# Patient Record
Sex: Female | Born: 1975 | Race: White | Hispanic: No | Marital: Married | Smoking: Current every day smoker
Health system: Southern US, Community
[De-identification: ages and names within clinical notes are randomized; demographics above are authoritative.]

## PROBLEM LIST (undated history)

## (undated) DIAGNOSIS — I10 Essential (primary) hypertension: Secondary | ICD-10-CM

---

## 2017-08-19 ENCOUNTER — Encounter (HOSPITAL_COMMUNITY): Payer: Self-pay | Admitting: Emergency Medicine

## 2017-08-19 ENCOUNTER — Emergency Department (HOSPITAL_COMMUNITY)
Admission: EM | Admit: 2017-08-19 | Discharge: 2017-08-19 | Disposition: A | Discharge status: Home / Self Care | Payer: Medicaid Other | Attending: Emergency Medicine | Admitting: Emergency Medicine

## 2017-08-19 ENCOUNTER — Emergency Department (HOSPITAL_COMMUNITY): Payer: Medicaid Other

## 2017-08-19 DIAGNOSIS — F17210 Nicotine dependence, cigarettes, uncomplicated: Secondary | ICD-10-CM | POA: Diagnosis not present

## 2017-08-19 DIAGNOSIS — R140 Abdominal distension (gaseous): Secondary | ICD-10-CM | POA: Diagnosis not present

## 2017-08-19 DIAGNOSIS — R1084 Generalized abdominal pain: Secondary | ICD-10-CM | POA: Diagnosis present

## 2017-08-19 DIAGNOSIS — N83201 Unspecified ovarian cyst, right side: Secondary | ICD-10-CM | POA: Diagnosis not present

## 2017-08-19 DIAGNOSIS — R1011 Right upper quadrant pain: Secondary | ICD-10-CM

## 2017-08-19 DIAGNOSIS — K625 Hemorrhage of anus and rectum: Secondary | ICD-10-CM

## 2017-08-19 DIAGNOSIS — I10 Essential (primary) hypertension: Secondary | ICD-10-CM | POA: Diagnosis not present

## 2017-08-19 HISTORY — DX: Essential (primary) hypertension: I10

## 2017-08-19 LAB — COMPREHENSIVE METABOLIC PANEL
ALT: 9 U/L — AB (ref 14–54)
AST: 15 U/L (ref 15–41)
Albumin: 4.2 g/dL (ref 3.5–5.0)
Alkaline Phosphatase: 44 U/L (ref 38–126)
Anion gap: 11 (ref 5–15)
BUN: 11 mg/dL (ref 6–20)
CHLORIDE: 104 mmol/L (ref 101–111)
CO2: 23 mmol/L (ref 22–32)
CREATININE: 0.85 mg/dL (ref 0.44–1.00)
Calcium: 9.2 mg/dL (ref 8.9–10.3)
GFR calc non Af Amer: >60 mL/min (ref >60)
Glucose, Bld: 88 mg/dL (ref 65–99)
POTASSIUM: 3.4 mmol/L — AB (ref 3.5–5.1)
SODIUM: 138 mmol/L (ref 135–145)
Total Bilirubin: 0.8 mg/dL (ref 0.3–1.2)
Total Protein: 7.3 g/dL (ref 6.5–8.1)

## 2017-08-19 LAB — URINALYSIS, ROUTINE W REFLEX MICROSCOPIC
BILIRUBIN URINE: NEGATIVE
Glucose, UA: NEGATIVE mg/dL
KETONES UR: 5 mg/dL — AB
LEUKOCYTES UA: NEGATIVE
Nitrite: NEGATIVE
PH: 6 (ref 5.0–8.0)
PROTEIN: NEGATIVE mg/dL
Specific Gravity, Urine: 1.01 (ref 1.005–1.030)

## 2017-08-19 LAB — CBC
HEMATOCRIT: 42.8 % (ref 36.0–46.0)
Hemoglobin: 14.3 g/dL (ref 12.0–15.0)
MCH: 30.1 pg (ref 26.0–34.0)
MCHC: 33.4 g/dL (ref 30.0–36.0)
MCV: 90.1 fL (ref 78.0–100.0)
PLATELETS: 267 10*3/uL (ref 150–400)
RBC: 4.75 MIL/uL (ref 3.87–5.11)
RDW: 12.7 % (ref 11.5–15.5)
WBC: 7.2 10*3/uL (ref 4.0–10.5)

## 2017-08-19 LAB — SAMPLE TO BLOOD BANK

## 2017-08-19 LAB — LIPASE, BLOOD: LIPASE: 47 U/L (ref 11–51)

## 2017-08-19 LAB — POC OCCULT BLOOD, ED: Fecal Occult Bld: POSITIVE — AB

## 2017-08-19 LAB — I-STAT BETA HCG BLOOD, ED (MC, WL, AP ONLY)

## 2017-08-19 LAB — PREGNANCY, URINE: PREG TEST UR: NEGATIVE

## 2017-08-19 MED ORDER — SODIUM CHLORIDE 0.9 % IV BOLUS (SEPSIS)
1000.0000 mL | Freq: Once | INTRAVENOUS | Status: AC
Start: 2017-08-19 — End: 2017-08-19
  Administered 2017-08-19: 1000 mL via INTRAVENOUS

## 2017-08-19 MED ORDER — ONDANSETRON 4 MG PO TBDP: 4.0000 mg | ORAL_TABLET | Freq: Three times a day (TID) | ORAL | 0 refills | Status: DC | PRN

## 2017-08-19 MED ORDER — IOPAMIDOL (ISOVUE-300) INJECTION 61%
100.0000 mL | Freq: Once | INTRAVENOUS | Status: AC | PRN
  Administered 2017-08-19: 100 mL via INTRAVENOUS

## 2017-08-19 MED ORDER — ONDANSETRON HCL 4 MG/2ML IJ SOLN: 4.0000 mg | Freq: Once | INTRAMUSCULAR | Status: DC

## 2017-08-19 MED ORDER — DICYCLOMINE HCL 20 MG PO TABS: 20.0000 mg | ORAL_TABLET | Freq: Four times a day (QID) | ORAL | 0 refills | Status: DC | PRN

## 2017-08-19 MED ORDER — SODIUM CHLORIDE 0.9 % IV BOLUS (SEPSIS)
500.0000 mL | Freq: Once | INTRAVENOUS | Status: AC
  Administered 2017-08-19: 500 mL via INTRAVENOUS

## 2017-08-19 NOTE — ED Triage Notes (Signed)
Pt C/O N/V on Saturday that turned into bloody stool. Pt states that it continued to be bloody until yesterday morning. Since yesterday morning pt has not passed and diarrhea.

## 2017-08-19 NOTE — Discharge Instructions (Signed)
Take the prescriptions as directed.  Increase your fluid intake (ie:  Gatoraide) for the next few days.  Eat a bland diet and advance to your regular diet slowly as you can tolerate it. Avoid full strength juices, as well as milk and milk products until your diarrhea has resolved.   Call your regular medical doctor today to schedule a follow up appointment this week. Call your OB/GYN doctor today to schedule a follow up appointment within the next week.  Return to the Emergency Department immediately sooner if worsening.

## 2017-08-19 NOTE — ED Provider Notes (Signed)
[facility] EMERGENCY DEPARTMENT Provider Note   CSN: [id number] Arrival date & time: 08/19/17  0550  Time seen 06:11 AM    History   Chief Complaint Chief Complaint  Patient presents with  . Abdominal Pain    HPI Karen Haas is a 41 y.o. female.  HPI patient reports the evening of December 15 she had multiple episodes of nausea and vomiting over several hours to the point of dry heaves.  She then started having loose diarrhea.  However the next morning, December 16 she started having bloody diarrhea and had 3-4 episodes a day, the last episode was yesterday morning, December 18.  She states she thinks she had a fever the first night because she was sweating and had chills.  She did eat a little bit yesterday however anything she ate made her abdomen feel very bloated.  She states her abdomen was very noisy and was rambling.  She started having right upper quadrant pain last night and bloating and took a Gas-X which helped.  430 this morning she woke up again with the pain.  She denies feeling lightheaded or dizzy.  She states she has had some tightness in her upper abdomen in the past that is resolved with taking Tums.  She also has had some abdominal tightening in the past.  She states her brother has a history of proctitis but she denies any family history of Crohn's or ulcerative colitis.  PCP Dr Thedore Mins in [place]  Past Medical History:  Diagnosis Date  . Hypertension     There are no active problems to display for this patient.   History reviewed. No pertinent surgical history.  OB History    No data available       Home Medications    Prior to Admission medications   Medication Sig Start Date End Date Taking? Authorizing Provider  lisinopril (PRINIVIL,ZESTRIL) 10 MG tablet Take 10 mg by mouth daily.   Yes [provider]    Family History No family history on file.  Social History Social History   Tobacco Use  . Smoking status: Current  Every Day Smoker    Types: Cigarettes  . Smokeless tobacco: Never Used  Substance Use Topics  . Alcohol use: No    Frequency: Never  . Drug use: No  lives at home Lives with spouse Self employed Environmental manager, blogger about farm life   Allergies   Patient has no allergy information on record.   Review of Systems Review of Systems  All other systems reviewed and are negative.    Physical Exam Updated Vital Signs BP (!) 156/97   Pulse 100   Temp 97.7 F (36.5 C) (Oral)   Resp 19   LMP 07/20/2017 (Approximate)   SpO2 100%   Vital signs normal borderline tachycardia   Physical Exam  Constitutional: She is oriented to person, place, and time. She appears well-developed and well-nourished.  Non-toxic appearance. She does not appear ill. No distress.  HENT:  Head: Normocephalic and atraumatic.  Right Ear: External ear normal.  Left Ear: External ear normal.  Nose: Nose normal. No mucosal edema or rhinorrhea.  Mouth/Throat: Oropharynx is clear and moist and mucous membranes are normal. No dental abscesses or uvula swelling.  Eyes: Conjunctivae and EOM are normal. Pupils are equal, round, and reactive to light.  Neck: Normal range of motion and full passive range of motion without pain. Neck supple.  Cardiovascular: Normal rate, regular rhythm and normal heart sounds. Exam reveals no  gallop and no friction rub.  No murmur heard. Pulmonary/Chest: Effort normal and breath sounds normal. No respiratory distress. She has no wheezes. She has no rhonchi. She has no rales. She exhibits no tenderness and no crepitus.  Abdominal: Soft. Normal appearance and bowel sounds are normal. She exhibits no distension. There is no tenderness. There is no rebound and no guarding.    Area of pain noted however she is nontender right now  Musculoskeletal: Normal range of motion. She exhibits no edema or tenderness.  Moves all extremities well.   Neurological: She is alert and oriented to  person, place, and time. She has normal strength. No cranial nerve deficit.  Skin: Skin is warm, dry and intact. No rash noted. No erythema. No pallor.  Psychiatric: Her speech is normal and behavior is normal. Her mood appears anxious.  Nursing note and vitals reviewed.    ED Treatments / Results  Labs (all labs ordered are listed, but only abnormal results are displayed) Results for orders placed or performed during the hospital encounter of 08/19/17  Lipase, blood  Result Value Ref Range   Lipase 47 11 - 51 U/L  Comprehensive metabolic panel  Result Value Ref Range   Sodium 138 135 - 145 mmol/L   Potassium 3.4 (L) 3.5 - 5.1 mmol/L   Chloride 104 101 - 111 mmol/L   CO2 23 22 - 32 mmol/L   Glucose, Bld 88 65 - 99 mg/dL   BUN 11 6 - 20 mg/dL   Creatinine, Ser 4.090.85 0.44 - 1.00 mg/dL   Calcium 9.2 8.9 - 81.110.3 mg/dL   Total Protein 7.3 6.5 - 8.1 g/dL   Albumin 4.2 3.5 - 5.0 g/dL   AST 15 15 - 41 U/L   ALT 9 (L) 14 - 54 U/L   Alkaline Phosphatase 44 38 - 126 U/L   Total Bilirubin 0.8 0.3 - 1.2 mg/dL   GFR calc non Af Amer >60 >60 mL/min   GFR calc Af Amer >60 >60 mL/min   Anion gap 11 5 - 15  CBC  Result Value Ref Range   WBC 7.2 4.0 - 10.5 K/uL   RBC 4.75 3.87 - 5.11 MIL/uL   Hemoglobin 14.3 12.0 - 15.0 g/dL   HCT 91.442.8 78.236.0 - 95.646.0 %   MCV 90.1 78.0 - 100.0 fL   MCH 30.1 26.0 - 34.0 pg   MCHC 33.4 30.0 - 36.0 g/dL   RDW 21.312.7 08.611.5 - 57.815.5 %   Platelets 267 150 - 400 K/uL  Urinalysis, Routine w reflex microscopic  Result Value Ref Range   Color, Urine YELLOW YELLOW   APPearance CLEAR CLEAR   Specific Gravity, Urine 1.010 1.005 - 1.030   pH 6.0 5.0 - 8.0   Glucose, UA NEGATIVE NEGATIVE mg/dL   Hgb urine dipstick MODERATE (A) NEGATIVE   Bilirubin Urine NEGATIVE NEGATIVE   Ketones, ur 5 (A) NEGATIVE mg/dL   Protein, ur NEGATIVE NEGATIVE mg/dL   Nitrite NEGATIVE NEGATIVE   Leukocytes, UA NEGATIVE NEGATIVE   RBC / HPF 0-5 0 - 5 RBC/hpf   WBC, UA 0-5 0 - 5 WBC/hpf    Bacteria, UA RARE (A) NONE SEEN   Squamous Epithelial / LPF 0-5 (A) NONE SEEN   Mucus PRESENT   Pregnancy, urine  Result Value Ref Range   Preg Test, Ur NEGATIVE NEGATIVE  I-Stat beta hCG blood, ED  Result Value Ref Range   I-stat hCG, quantitative <5.0 <5 mIU/mL   Comment 3  Laboratory interpretation all normal except mild hypokalemia   EKG  EKG Interpretation None       Radiology No results found.  Procedures Procedures (including critical care time)  Medications Ordered in ED Medications  sodium chloride 0.9 % bolus 500 mL (not administered)  ondansetron (ZOFRAN) injection 4 mg (not administered)  sodium chloride 0.9 % bolus 1,000 mL (1,000 mLs Intravenous New Bag/Given 08/19/17 0639)  iopamidol (ISOVUE-300) 61 % injection 100 mL (100 mLs Intravenous Contrast Given 08/19/17 0654)     Initial Impression / Assessment and Plan / ED Course  I have reviewed the triage vital signs and the nursing notes.  Pertinent labs & imaging results that were available during my care of the patient were reviewed by me and considered in my medical decision making (see chart for details).     Laboratory testing was done.  CT of the abdomen and ultrasound of the gallbladder was done to evaluate her complaints.  She may have underlying inflammatory bowel disease however the pain on the right upper quadrant consideration for gallbladder disease should be considered although that would not cause rectal bleeding.  Her brother does have a history of proctitis so there may be some family history of inflammatory bowel disease.  She is refusing pain or nausea medicine at this time.  She was given IV fluids.  Recheck at 7:50 AM we discussed her laboratory results which were normal.  Looked at her CT scan, will we are still waiting for the radiologist to read it but she does have a lot of gas and distention of her colon diffusely.  She has some cysts in both kidneys. IUD in place.   08:00  AM Patient left at change of shift with Dr Clarene DukeMcManus to get results of her AP CT scan and US RUQ.   Final Clinical Impressions(s) / ED Diagnoses   Final diagnoses:  Abdominal bloating  Rectal bleeding  RUQ pain    Disposition pending  Devoria AlbeIva Elyssia Strausser, MD, Concha PyoFACEP    Ronnette Rump, MD 08/19/17 [phone]

## 2017-08-19 NOTE — ED Provider Notes (Signed)
Pt received at sign out with CT A/P and US RUQ pending.  Pt with RUQ pain, "gassy" feeling abd, N/V/D. Labs reassuring. CT and US without acute surgical or infectious process. No vomiting/diarrhea in ED. Tol PO. Tx symptomatically at this time. Dx and testing d/w pt.  Questions answered.  Verb understanding, agreeable to d/c home with outpt f/u.   Results for orders placed or performed during the hospital encounter of 08/19/17  Lipase, blood  Result Value Ref Range   Lipase 47 11 - 51 U/L  Comprehensive metabolic panel  Result Value Ref Range   Sodium 138 135 - 145 mmol/L   Potassium 3.4 (L) 3.5 - 5.1 mmol/L   Chloride 104 101 - 111 mmol/L   CO2 23 22 - 32 mmol/L   Glucose, Bld 88 65 - 99 mg/dL   BUN 11 6 - 20 mg/dL   Creatinine, Ser 1.610.85 0.44 - 1.00 mg/dL   Calcium 9.2 8.9 - 09.610.3 mg/dL   Total Protein 7.3 6.5 - 8.1 g/dL   Albumin 4.2 3.5 - 5.0 g/dL   AST 15 15 - 41 U/L   ALT 9 (L) 14 - 54 U/L   Alkaline Phosphatase 44 38 - 126 U/L   Total Bilirubin 0.8 0.3 - 1.2 mg/dL   GFR calc non Af Amer >60 >60 mL/min   GFR calc Af Amer >60 >60 mL/min   Anion gap 11 5 - 15  CBC  Result Value Ref Range   WBC 7.2 4.0 - 10.5 K/uL   RBC 4.75 3.87 - 5.11 MIL/uL   Hemoglobin 14.3 12.0 - 15.0 g/dL   HCT 04.542.8 40.936.0 - 81.146.0 %   MCV 90.1 78.0 - 100.0 fL   MCH 30.1 26.0 - 34.0 pg   MCHC 33.4 30.0 - 36.0 g/dL   RDW 91.412.7 78.211.5 - 95.615.5 %   Platelets 267 150 - 400 K/uL  Urinalysis, Routine w reflex microscopic  Result Value Ref Range   Color, Urine YELLOW YELLOW   APPearance CLEAR CLEAR   Specific Gravity, Urine 1.010 1.005 - 1.030   pH 6.0 5.0 - 8.0   Glucose, UA NEGATIVE NEGATIVE mg/dL   Hgb urine dipstick MODERATE (A) NEGATIVE   Bilirubin Urine NEGATIVE NEGATIVE   Ketones, ur 5 (A) NEGATIVE mg/dL   Protein, ur NEGATIVE NEGATIVE mg/dL   Nitrite NEGATIVE NEGATIVE   Leukocytes, UA NEGATIVE NEGATIVE   RBC / HPF 0-5 0 - 5 RBC/hpf   WBC, UA 0-5 0 - 5 WBC/hpf   Bacteria, UA RARE (A) NONE SEEN    Squamous Epithelial / LPF 0-5 (A) NONE SEEN   Mucus PRESENT   Pregnancy, urine  Result Value Ref Range   Preg Test, Ur NEGATIVE NEGATIVE  I-Stat beta hCG blood, ED  Result Value Ref Range   I-stat hCG, quantitative <5.0 <5 mIU/mL   Comment 3          POC occult blood, ED RN will collect  Result Value Ref Range   Fecal Occult Bld POSITIVE (A) NEGATIVE  Sample to Blood Bank  Result Value Ref Range   Blood Bank Specimen SAMPLE AVAILABLE FOR TESTING    Sample Expiration 08/22/2017    Ct Abdomen Pelvis W Contrast Result Date: 08/19/2017 CLINICAL DATA:  Nausea and vomiting on Saturday.  Blood in stool. EXAM: CT ABDOMEN AND PELVIS WITH CONTRAST TECHNIQUE: Multidetector CT imaging of the abdomen and pelvis was performed using the standard protocol following bolus administration of intravenous contrast. CONTRAST:  100mL ISOVUE-300  IOPAMIDOL (ISOVUE-300) INJECTION 61% COMPARISON:  None. FINDINGS: Lower chest: Clear lung bases. Heart size accentuated by a pectus excavatum deformity. Hepatobiliary: Minimal motion degradation throughout. Normal liver. Normal gallbladder, without biliary ductal dilatation. Pancreas: Normal, without mass or ductal dilatation. Spleen: Normal in size, without focal abnormality. Adrenals/Urinary Tract: Normal adrenal glands. Punctate upper and lower pole left renal collecting system calculi. Well-circumscribed bilateral renal lesions are too small to characterize but likely cysts. No hydronephrosis. Normal urinary bladder. Stomach/Bowel: Normal stomach, without wall thickening. Normal colon. Appendix likely identified and normal, including on image 61/ series 2. Normal small bowel. Vascular/Lymphatic: Normal caliber of the aorta and branch vessels. No abdominopelvic adenopathy. Reproductive: Intrauterine device. Right ovarian corpus luteal cyst suspected, including at 2.3 cm on image 65/series 2. Adjacent more simple appearing 3.7 cm right ovarian cyst. Other: Trace cul-de-sac  fluid may be physiologic. There is trace perihepatic ascites, including on image 37/series 2. Musculoskeletal: T11 vertebral hemangioma. IMPRESSION: 1.  No acute process in the abdomen or pelvis. 2. Small volume perihepatic ascites, of indeterminate etiology. 3. Right ovarian corpus luteal cyst. Small volume cul-de-sac fluid could be physiologic or related to recent cyst rupture. 4. Minimal motion degradation. 5. Left nephrolithiasis. Electronically Signed   By: Jeronimo GreavesKyle  Talbot M.D.   On: 08/19/2017 08:08   [place] Abdomen Limited Ruq Result Date: 08/19/2017 CLINICAL DATA:  Right upper quadrant pain and bloating for 1 day. EXAM: ULTRASOUND ABDOMEN LIMITED RIGHT UPPER QUADRANT COMPARISON:  CT abdomen and pelvis 08/19/2017 FINDINGS: Gallbladder: No gallstones or wall thickening visualized. No sonographic Murphy sign noted by sonographer. Common bile duct: Diameter: 2 mm Liver: No focal lesion identified. Within normal limits in parenchymal echogenicity. Portal vein is patent on color Doppler imaging with normal direction of blood flow towards the liver. IMPRESSION: Unremarkable right upper quadrant ultrasound. Electronically Signed   By: Sebastian AcheAllen  Grady M.D.   On: 08/19/2017 08:43      Samuel JesterMcManus, Ashrita Chrismer, DO 08/19/17 [phone]

## 2017-08-24 ENCOUNTER — Encounter: Payer: Self-pay | Admitting: Nurse Practitioner

## 2017-08-24 ENCOUNTER — Other Ambulatory Visit: Payer: Self-pay

## 2017-08-24 ENCOUNTER — Ambulatory Visit: Payer: Medicaid Other | Admitting: Nurse Practitioner

## 2017-08-24 DIAGNOSIS — R634 Abnormal weight loss: Secondary | ICD-10-CM

## 2017-08-24 DIAGNOSIS — R109 Unspecified abdominal pain: Secondary | ICD-10-CM

## 2017-08-24 DIAGNOSIS — R197 Diarrhea, unspecified: Secondary | ICD-10-CM

## 2017-08-24 DIAGNOSIS — K625 Hemorrhage of anus and rectum: Secondary | ICD-10-CM

## 2017-08-24 MED ORDER — NA SULFATE-K SULFATE-MG SULF 17.5-3.13-1.6 GM/177ML PO SOLN: 1.0000 | ORAL | 0 refills | Status: DC

## 2017-08-24 NOTE — Progress Notes (Signed)
Primary Care Physician:  Smith RobertKikel, Stephen, MD Primary Gastroenterologist:  Dr. Darrick PennaFields  Chief Complaint  Patient presents with  . Rectal Bleeding    Happened about 12 times last week    HPI:   Karen Haas is a 41 y.o. female who presents on referral from primary care for rectal bleeding.  PCP notes reviewed, last saw a primary care 08/16/2017 which noted persistent rectal bleeding, bloody stools, abdominal cramping, diarrhea.  Known history of hemorrhoids.  Unexplained weight loss.  The patient was seen in the emergency department 08/19/2017 for abdominal pain.  At that time noted nausea and vomiting as well, diarrhea, bloody diarrhea 3-4 episodes a day.  Query fever the night before because of sweating and chills.  This yeast today after.  Noted overall general GI upset.  CBC, CMP, lipase essentially normal.  Negative urine pregnancy test.  Family history of inflammatory bowel disease in her brother with proctitis.  CT abdomen found no acute process, right ovarian corpus luteal cyst, minimal motion degradation, left nephrolithiasis.  Ultrasound completed found unremarkable right upper quadrant ultrasound.  Today she states she's doing ok overall. Has never had a colonoscopy. She had about 12 episodes of rectal bleeding over 4-5 days, last episode of blood 08/18/17. Symptoms started with diarrhea and abdominal pain followed by rectal bleeding. She has Oyster stew around the time she started with symptoms, but nobody else became ill. Thinks she may be lactose intolerant due to gas and diarrhea. Denies abdominal pain in about the past week. States when she started having issues her stools were "small pieces that were really dark." History of intermittent/rare GERD, none in the past week or so. Occasional/rare NSAIDs,no ASA powders. Does have a history of hemorrhoids. Denies N/V currently. Has had some unintentional weight loss of about 5 lbs (enough that her husband noted). Decreased appetite.  Denies fever, chills, acute changes in bowel habits (other than per above). Denies chest pain, dyspnea, dizziness, lightheadedness, syncope, near syncope. Denies any other upper or lower GI symptoms.  Past Medical History:  Diagnosis Date  . Hypertension     History reviewed. No pertinent surgical history.  Current Outpatient Medications  Medication Sig Dispense Refill  . lisinopril (PRINIVIL,ZESTRIL) 10 MG tablet Take 10 mg by mouth daily.     No current facility-administered medications for this visit.     Allergies as of 08/24/2017  . (No Known Allergies)    Family History  Problem Relation Age of Onset  . Inflammatory bowel disease Brother        proctitis  . Colon cancer Neg Hx     Social History   Socioeconomic History  . Marital status: Married    Spouse name: Not on file  . Number of children: Not on file  . Years of education: Not on file  . Highest education level: Not on file  Social Needs  . Financial resource strain: Not on file  . Food insecurity - worry: Not on file  . Food insecurity - inability: Not on file  . Transportation needs - medical: Not on file  . Transportation needs - non-medical: Not on file  Occupational History  . Not on file  Tobacco Use  . Smoking status: Current Every Day Smoker    Packs/day: 0.10    Types: Cigarettes  . Smokeless tobacco: Never Used  Substance and Sexual Activity  . Alcohol use: No    Frequency: Never  . Drug use: No  . Sexual activity: Not on  file  Other Topics Concern  . Not on file  Social History Narrative  . Not on file    Review of Systems: Complete ROS negative except as per HPI.    Physical Exam: BP 138/85   Pulse 98   Temp (!) 97.3 F (36.3 C) (Oral)   Ht 5\' 9"  (1.753 m)   Wt 154 lb (69.9 kg)   LMP 08/23/2017 (Exact Date)   BMI 22.74 kg/m  General:   Alert and oriented. Pleasant and cooperative. Well-nourished and well-developed.  Eyes:  Without icterus, sclera clear and conjunctiva  pink.  Ears:  Normal auditory acuity. Cardiovascular:  S1, S2 present without murmurs appreciated. Extremities without clubbing or edema. Respiratory:  Clear to auscultation bilaterally. No wheezes, rales, or rhonchi. No distress.  Gastrointestinal:  +BS, soft, non-tender and non-distended. No HSM noted. No guarding or rebound. No masses appreciated.  Rectal:  Deferred  Musculoskalatal:  Symmetrical without gross deformities. Neurologic:  Alert and oriented x4;  grossly normal neurologically. Psych:  Alert and cooperative. Normal mood and affect. Heme/Lymph/Immune: No excessive bruising noted.    08/24/2017 9:18 AM   Disclaimer: This note was dictated with voice recognition software. Similar sounding words can inadvertently be transcribed and may not be corrected upon review.

## 2017-08-24 NOTE — Assessment & Plan Note (Addendum)
The patient has had weight loss in the past 1-2 weeks with her acute illness.  This is somewhat expected.  However, before that she has had about 5-10 pound weight loss unintentionally to the point that her husband noticed and asked her about it.  Has also had decreased appetite.  Given her constellation of symptoms we will proceed with colonoscopy as per above.  Return for follow-up in 3 months.

## 2017-08-24 NOTE — Assessment & Plan Note (Signed)
Acute onset right-sided abdominal pain in addition to diarrhea and rectal bleeding.  CT the abdomen did find a right corpus luteal cyst which could at least in part explain her abdominal pain.  Her pain is better now.  The cyst and/or abdominal pain could have been incidental.  However, given her constellation of symptoms we will plan for colonoscopy as per below.  Return for follow-up in 3 months.

## 2017-08-24 NOTE — Patient Instructions (Addendum)
1. We will provide you with a list of primary care providers in the area to help you find a PCP to become established with them. 2. Have your lab tests drawn when you are able to. 3. We will schedule your procedure for you. 4. Return for follow-up in 3 months. 5. Call if you have any questions or concerns.

## 2017-08-24 NOTE — Assessment & Plan Note (Signed)
During her symptomatic episode she was having rectal bleeding.  She had a total of 12 episodes.  Mostly with bowel movements but some more just frank bleeding.  Last bleeding episode about 1 week ago.  She does have a brother with a history of proctitis/inflammatory bowel disease.  No family history of colon cancer.  Given her constellation of symptoms and rectal bleeding we will plan for colonoscopy.  Return for follow-up in 3 months.  Proceed with colonoscopy with Dr. Darrick PennaFields in the near future. The risks, benefits, and alternatives have been discussed in detail with the patient. They state understanding and desire to proceed.   The patient is not currently on any anticoagulants, anxiolytics, chronic pain medications, or antidepressants.  Conscious sedation should be adequate for her procedure.

## 2017-08-24 NOTE — Assessment & Plan Note (Signed)
About 2 weeks ago the patient began having acute onset abdominal pain, diarrhea followed shortly thereafter by rectal bleeding (as per above).  She did have oyster stew, but doubts this as the etiology given that nobody else became ill.  She thinks she may have some lactose intolerance, and we can work this out at her follow-up visit.  Diarrhea was persistent for about 5-6 days and then self resolved.  She does have a history of hemorrhoids.  Diarrhea with hemorrhoids in the realm of self-limiting gastroenteritis could have caused rectal bleeding.  Given her constellation of symptoms we will plan for colonoscopy as per below.  Return for follow-up in 3 months.  No ongoing diarrhea currently.

## 2017-08-24 NOTE — Progress Notes (Signed)
CC'D TO PCP °

## 2017-08-25 LAB — CBC WITH DIFFERENTIAL/PLATELET
BASOS ABS: 40 {cells}/uL (ref 0–200)
Basophils Relative: 0.8 %
EOS ABS: 40 {cells}/uL (ref 15–500)
EOS PCT: 0.8 %
HCT: 41.4 % (ref 35.0–45.0)
HEMOGLOBIN: 14.1 g/dL (ref 11.7–15.5)
Lymphs Abs: 1240 cells/uL (ref 850–3900)
MCH: 30.1 pg (ref 27.0–33.0)
MCHC: 34.1 g/dL (ref 32.0–36.0)
MCV: 88.3 fL (ref 80.0–100.0)
MONOS PCT: 6.7 %
MPV: 10.6 fL (ref 7.5–12.5)
NEUTROS ABS: 3345 {cells}/uL (ref 1500–7800)
Neutrophils Relative %: 66.9 %
PLATELETS: 278 10*3/uL (ref 140–400)
RBC: 4.69 10*6/uL (ref 3.80–5.10)
RDW: 12 % (ref 11.0–15.0)
TOTAL LYMPHOCYTE: 24.8 %
WBC mixed population: 335 cells/uL (ref 200–950)
WBC: 5 10*3/uL (ref 3.8–10.8)

## 2017-08-25 LAB — COMPREHENSIVE METABOLIC PANEL
AG RATIO: 2 (calc) (ref 1.0–2.5)
ALT: 8 U/L (ref 6–29)
AST: 13 U/L (ref 10–30)
Albumin: 4.4 g/dL (ref 3.6–5.1)
Alkaline phosphatase (APISO): 51 U/L (ref 33–115)
BUN: 8 mg/dL (ref 7–25)
CO2: 26 mmol/L (ref 20–32)
CREATININE: 0.77 mg/dL (ref 0.50–1.10)
Calcium: 9 mg/dL (ref 8.6–10.2)
Chloride: 106 mmol/L (ref 98–110)
GLUCOSE: 93 mg/dL (ref 65–139)
Globulin: 2.2 g/dL (calc) (ref 1.9–3.7)
Potassium: 3.6 mmol/L (ref 3.5–5.3)
SODIUM: 140 mmol/L (ref 135–146)
TOTAL PROTEIN: 6.6 g/dL (ref 6.1–8.1)
Total Bilirubin: 0.5 mg/dL (ref 0.2–1.2)

## 2017-08-25 LAB — SEDIMENTATION RATE: SED RATE: 2 mm/h (ref 0–20)

## 2017-08-25 LAB — C-REACTIVE PROTEIN: CRP: 0.5 mg/L (ref <8.0)

## 2017-09-02 NOTE — Progress Notes (Signed)
Left vm that labs are normal. Keep appointments as scheduled and call if questions.

## 2017-09-04 ENCOUNTER — Encounter (HOSPITAL_COMMUNITY): Payer: Self-pay | Admitting: *Deleted

## 2017-09-04 ENCOUNTER — Ambulatory Visit (HOSPITAL_COMMUNITY)
Admission: RE | Admit: 2017-09-04 | Discharge: 2017-09-04 | Disposition: A | Discharge status: Home / Self Care | Payer: Medicaid Other | Source: Ambulatory Visit | Attending: Gastroenterology | Admitting: Gastroenterology

## 2017-09-04 ENCOUNTER — Encounter (HOSPITAL_COMMUNITY)
Admission: RE | Disposition: A | Discharge status: Home / Self Care | Payer: Self-pay | Source: Ambulatory Visit | Attending: Gastroenterology

## 2017-09-04 DIAGNOSIS — K648 Other hemorrhoids: Secondary | ICD-10-CM | POA: Insufficient documentation

## 2017-09-04 DIAGNOSIS — R634 Abnormal weight loss: Secondary | ICD-10-CM

## 2017-09-04 DIAGNOSIS — R197 Diarrhea, unspecified: Secondary | ICD-10-CM

## 2017-09-04 DIAGNOSIS — R109 Unspecified abdominal pain: Secondary | ICD-10-CM

## 2017-09-04 DIAGNOSIS — Q439 Congenital malformation of intestine, unspecified: Secondary | ICD-10-CM | POA: Insufficient documentation

## 2017-09-04 DIAGNOSIS — Z791 Long term (current) use of non-steroidal anti-inflammatories (NSAID): Secondary | ICD-10-CM | POA: Diagnosis not present

## 2017-09-04 DIAGNOSIS — R1031 Right lower quadrant pain: Secondary | ICD-10-CM | POA: Insufficient documentation

## 2017-09-04 DIAGNOSIS — Z79899 Other long term (current) drug therapy: Secondary | ICD-10-CM | POA: Diagnosis not present

## 2017-09-04 DIAGNOSIS — K625 Hemorrhage of anus and rectum: Secondary | ICD-10-CM

## 2017-09-04 DIAGNOSIS — F17210 Nicotine dependence, cigarettes, uncomplicated: Secondary | ICD-10-CM | POA: Diagnosis not present

## 2017-09-04 DIAGNOSIS — K644 Residual hemorrhoidal skin tags: Secondary | ICD-10-CM | POA: Insufficient documentation

## 2017-09-04 DIAGNOSIS — K921 Melena: Secondary | ICD-10-CM | POA: Diagnosis not present

## 2017-09-04 DIAGNOSIS — I10 Essential (primary) hypertension: Secondary | ICD-10-CM | POA: Diagnosis not present

## 2017-09-04 HISTORY — PX: COLONOSCOPY: SHX5424

## 2017-09-04 SURGERY — COLONOSCOPY
Anesthesia: Moderate Sedation

## 2017-09-04 MED ORDER — MEPERIDINE HCL 100 MG/ML IJ SOLN
INTRAMUSCULAR | Status: DC | PRN
  Administered 2017-09-04 (×4): 25 mg via INTRAVENOUS

## 2017-09-04 MED ORDER — STERILE WATER FOR IRRIGATION IR SOLN
Status: DC | PRN
  Administered 2017-09-04: 2.5 mL

## 2017-09-04 MED ORDER — MIDAZOLAM HCL 5 MG/5ML IJ SOLN
INTRAMUSCULAR | Status: AC
  Filled 2017-09-04: qty 10

## 2017-09-04 MED ORDER — MEPERIDINE HCL 100 MG/ML IJ SOLN
INTRAMUSCULAR | Status: AC
  Filled 2017-09-04: qty 2

## 2017-09-04 MED ORDER — SODIUM CHLORIDE 0.9 % IV SOLN
INTRAVENOUS | Status: DC
  Administered 2017-09-04: 08:00:00 via INTRAVENOUS

## 2017-09-04 MED ORDER — MIDAZOLAM HCL 5 MG/5ML IJ SOLN
INTRAMUSCULAR | Status: DC | PRN
  Administered 2017-09-04: 2 mg via INTRAVENOUS
  Administered 2017-09-04: 1 mg via INTRAVENOUS
  Administered 2017-09-04: 2 mg via INTRAVENOUS

## 2017-09-04 NOTE — Discharge Instructions (Signed)
You have moderate size internal hemorrhoids. YOU DID NOT HAVE ANY POLYPS.   DRINK WATER TO KEEP YOUR URINE LIGHT YELLOW.  FOLLOW A HIGH FIBER DIET. AVOID ITEMS THAT CAUSE BLOATING. SEE INFO BELOW.  USE PREPARATION H FOUR TIMES  A DAY IF NEEDED TO RELIEVE RECTAL PAIN/PRESSURE/BLEEDING.  FOLLOW UP IN 6 MOS.   Next colonoscopy in 10 years.  Colonoscopy Care After Read the instructions outlined below and refer to this sheet in the next week. These discharge instructions provide you with general information on caring for yourself after you leave the hospital. While your treatment has been planned according to the most current medical practices available, unavoidable complications occasionally occur. If you have any problems or questions after discharge, call DR. Daymeon Fischman, [phone].  ACTIVITY  You may resume your regular activity, but move at a slower pace for the next 24 hours.   Take frequent rest periods for the next 24 hours.   Walking will help get rid of the air and reduce the bloated feeling in your belly (abdomen).   No driving for 24 hours (because of the medicine (anesthesia) used during the test).   You may shower.   Do not sign any important legal documents or operate any machinery for 24 hours (because of the anesthesia used during the test).    NUTRITION  Drink plenty of fluids.   You may resume your normal diet as instructed by your doctor.   Begin with a light meal and progress to your normal diet. Heavy or fried foods are harder to digest and may make you feel sick to your stomach (nauseated).   Avoid alcoholic beverages for 24 hours or as instructed.    MEDICATIONS  You may resume your normal medications.   WHAT YOU CAN EXPECT TODAY  Some feelings of bloating in the abdomen.   Passage of more gas than usual.   Spotting of blood in your stool or on the toilet paper  .  IF YOU HAD POLYPS REMOVED DURING THE COLONOSCOPY:  Eat a soft diet IF YOU  HAVE NAUSEA, BLOATING, ABDOMINAL PAIN, OR VOMITING.    FINDING OUT THE RESULTS OF YOUR TEST Not all test results are available during your visit. DR. Darrick PennaFIELDS WILL CALL YOU WITHIN 14 DAYS OF YOUR PROCEDUE WITH YOUR RESULTS. Do not assume everything is normal if you have not heard from DR. Elnoria Livingston, CALL HER OFFICE AT [phone].  SEEK IMMEDIATE MEDICAL ATTENTION AND CALL THE OFFICE: [phone] IF:  You have more than a spotting of blood in your stool.   Your belly is swollen (abdominal distention).   You are nauseated or vomiting.   You have a temperature over 101F.   You have abdominal pain or discomfort that is severe or gets worse throughout the day.  High-Fiber Diet A high-fiber diet changes your normal diet to include more whole grains, legumes, fruits, and vegetables. Changes in the diet involve replacing refined carbohydrates with unrefined foods. The calorie level of the diet is essentially unchanged. The Dietary Reference Intake (recommended amount) for adult males is 38 grams per day. For adult females, it is 25 grams per day. Pregnant and lactating women should consume 28 grams of fiber per day. Fiber is the intact part of a plant that is not broken down during digestion. Functional fiber is fiber that has been isolated from the plant to provide a beneficial effect in the body. PURPOSE  Increase stool bulk.   Ease and regulate bowel movements.   Lower  cholesterol.   REDUCE RISK OF COLON CANCER  INDICATIONS THAT YOU NEED MORE FIBER  Constipation and hemorrhoids.   Uncomplicated diverticulosis (intestine condition) and irritable bowel syndrome.   Weight management.   As a protective measure against hardening of the arteries (atherosclerosis), diabetes, and cancer.   GUIDELINES FOR INCREASING FIBER IN THE DIET  Start adding fiber to the diet slowly. A gradual increase of about 5 more grams (2 slices of whole-wheat bread, 2 servings of most fruits or vegetables, or 1  bowl of high-fiber cereal) per day is best. Too rapid an increase in fiber may result in constipation, flatulence, and bloating.   Drink enough water and fluids to keep your urine clear or pale yellow. Water, juice, or caffeine-free drinks are recommended. Not drinking enough fluid may cause constipation.   Eat a variety of high-fiber foods rather than one type of fiber.   Try to increase your intake of fiber through using high-fiber foods rather than fiber pills or supplements that contain small amounts of fiber.   The goal is to change the types of food eaten. Do not supplement your present diet with high-fiber foods, but replace foods in your present diet.    INCLUDE A VARIETY OF FIBER SOURCES  Replace refined and processed grains with whole grains, canned fruits with fresh fruits, and incorporate other fiber sources. White rice, white breads, and most bakery goods contain little or no fiber.   Brown whole-grain rice, buckwheat oats, and many fruits and vegetables are all good sources of fiber. These include: broccoli, Brussels sprouts, cabbage, cauliflower, beets, sweet potatoes, white potatoes (skin on), carrots, tomatoes, eggplant, squash, berries, fresh fruits, and dried fruits.   Cereals appear to be the richest source of fiber. Cereal fiber is found in whole grains and bran. Bran is the fiber-rich outer coat of cereal grain, which is largely removed in refining. In whole-grain cereals, the bran remains. In breakfast cereals, the largest amount of fiber is found in those with "bran" in their names. The fiber content is sometimes indicated on the label.   You may need to include additional fruits and vegetables each day.   In baking, for 1 cup white flour, you may use the following substitutions:   1 cup whole-wheat flour minus 2 tablespoons.   1/2 cup white flour plus 1/2 cup whole-wheat flour.   Hemorrhoids Hemorrhoids are dilated (enlarged) veins around the rectum. Sometimes  clots will form in the veins. This makes them swollen and painful. These are called thrombosed hemorrhoids. Causes of hemorrhoids include:  Constipation.   Straining to have a bowel movement.   HEAVY LIFTING  HOME CARE INSTRUCTIONS  Eat a well balanced diet and drink 6 to 8 glasses of water every day to avoid constipation. You may also use a bulk laxative.   Avoid straining to have bowel movements.   Keep anal area dry and clean.   Do not use a donut shaped pillow or sit on the toilet for long periods. This increases blood pooling and pain.   Move your bowels when your body has the urge; this will require less straining and will decrease pain and pressure.

## 2017-09-04 NOTE — H&P (Signed)
Primary Care Physician:  Vidal Schwalbe, MD Primary Gastroenterologist:  Dr. Oneida Alar  Pre-Procedure History & Physical: HPI:  Karen Haas is a 42 y.o. female here for Diarrhea/BRBPR.  Past Medical History:  Diagnosis Date  . Hypertension     History reviewed. No pertinent surgical history.  Prior to Admission medications   Medication Sig Start Date End Date Taking? Authorizing Provider  ibuprofen (ADVIL,MOTRIN) 200 MG tablet Take 400 mg by mouth every 8 (eight) hours as needed (FOR PAIN.).   Yes [provider]  levonorgestrel (MIRENA, 52 MG,) 20 MCG/24HR IUD 1 Device by Intrauterine route once.   Yes [provider]  lisinopril (PRINIVIL,ZESTRIL) 10 MG tablet Take 10 mg by mouth daily.   Yes [provider]  Na Sulfate-K Sulfate-Mg Sulf (SUPREP BOWEL PREP KIT) 17.5-3.13-1.6 GM/177ML SOLN Take 1 kit by mouth as directed. 08/24/17  Yes Kawan Valladolid L, MD  PROCTOSOL HC 2.5 % rectal cream Apply 1 application topically 2 (two) times daily as needed for hemorrhoids. 08/16/17  Yes [provider]    Allergies as of 08/24/2017  . (No Known Allergies)    Family History  Problem Relation Age of Onset  . Inflammatory bowel disease Brother        proctitis  . Colon cancer Neg Hx     Social History   Socioeconomic History  . Marital status: Married    Spouse name: Not on file  . Number of children: Not on file  . Years of education: Not on file  . Highest education level: Not on file  Social Needs  . Financial resource strain: Not on file  . Food insecurity - worry: Not on file  . Food insecurity - inability: Not on file  . Transportation needs - medical: Not on file  . Transportation needs - non-medical: Not on file  Occupational History  . Not on file  Tobacco Use  . Smoking status: Current Every Day Smoker    Packs/day: 0.10    Types: Cigarettes  . Smokeless tobacco: Never Used  Substance and Sexual Activity  . Alcohol use: No   Frequency: Never  . Drug use: No  . Sexual activity: Not on file  Other Topics Concern  . Not on file  Social History Narrative  . Not on file    Review of Systems: See HPI, otherwise negative ROS   Physical Exam: BP 109/60   Pulse 75   Temp 98.1 F (36.7 C) (Oral)   Resp 16   LMP 08/23/2017 (Exact Date)   SpO2 100%  General:   Alert,  pleasant and cooperative in NAD Head:  Normocephalic and atraumatic. Neck:  Supple; Lungs:  Clear throughout to auscultation.    Heart:  Regular rate and rhythm. Abdomen:  Soft, nontender and nondistended. Normal bowel sounds, without guarding, and without rebound.   Neurologic:  Alert and  oriented x4;  grossly normal neurologically.  Impression/Plan:     Diarrhea/BRBPR  PLAN: TCS TODAY WITH BIOPSY DISCUSSED PROCEDURE, BENEFITS, & RISKS: < 1% chance of medication reaction, bleeding, perforation, or rupture of spleen/liver.

## 2017-09-04 NOTE — Op Note (Signed)
[facility] Patient Name: Karen Haas Procedure Date: 09/04/2017 8:35 AM MRN: [id number] Date of Birth: 1976-05-29 Attending MD: Jonette Eva MD, MD CSN: [id number] Age: 42 Admit Type: Outpatient Procedure:                Colonoscopy, Diagnostic Indications:              Abdominal pain in the right lower quadrant,                            Diarrhea, Hematochezia. NOW HAVING NORMAL STOOL. Providers:                Jonette Eva MD, MD, Nena Polio, RN, Lollie Marrow.                            [place], Pensions consultant Referring MD:             Smith Robert Medicines:                Meperidine 100 mg IV, Midazolam 5 mg IV Complications:            No immediate complications. Estimated Blood Loss:     Estimated blood loss: none. Procedure:                Pre-Anesthesia Assessment:                           - Prior to the procedure, a History and Physical                            was performed, and patient medications and                            allergies were reviewed. The patient's tolerance of                            previous anesthesia was also reviewed. The risks                            and benefits of the procedure and the sedation                            options and risks were discussed with the patient.                            All questions were answered, and informed consent                            was obtained. Prior Anticoagulants: The patient has                            taken ibuprofen, last dose was 7 days prior to                            procedure. ASA Grade Assessment: I - A normal,  healthy patient. After reviewing the risks and                            benefits, the patient was deemed in satisfactory                            condition to undergo the procedure. After obtaining                            informed consent, the colonoscope was passed under                            direct vision. Throughout the procedure,  the                            patient's blood pressure, pulse, and oxygen                            saturations were monitored continuously. The                            EC-[id number] ([id number]) scope was introduced through                            the anus and advanced to the 5 cm into the ileum.                            The colonoscopy was technically difficult and                            complex due to significant looping. Successful                            completion of the procedure was aided by increasing                            the dose of sedation medication, changing the                            patient to a supine position, using manual pressure                            and COLOWRAP. The patient tolerated the procedure                            fairly well. The quality of the bowel preparation                            was good. The terminal ileum, ileocecal valve,                            appendiceal orifice, and rectum were photographed. Scope In: 9:02:39 AM Scope Out: 9:31:41 AM Scope Withdrawal Time: 0 hours 13 minutes  31 seconds  Total Procedure Duration: 0 hours 29 minutes 2 seconds  Findings:      The terminal ileum appeared normal.      The recto-sigmoid colon, sigmoid colon, descending colon and splenic       flexure revealed significantly excessive looping.      The exam was otherwise without abnormality.      External and internal hemorrhoids were found during retroflexion. The       hemorrhoids were medium-sized. Impression:               - The examined portion of the ileum was normal.                           - There was significant looping of the colon.                           - The examination was otherwise normal.                           - RECTAL BLEEDING DUE TO internal hemorrhoids. Moderate Sedation:      Moderate (conscious) sedation was administered by the endoscopy nurse       and supervised by the endoscopist. The following  parameters were       monitored: oxygen saturation, heart rate, blood pressure, and response       to care. Total physician intraservice time was 42 minutes. Recommendation:           - Repeat colonoscopy in 10 years for surveillance                            WITH COLOWRAP.                           - High fiber diet.                           - Continue present medications.                           - Return to my office in 6 months.                           - Patient has a contact number available for                            emergencies. The signs and symptoms of potential                            delayed complications were discussed with the                            patient. Return to normal activities tomorrow.                            Written discharge instructions were provided to the  patient. Procedure Code(s):        --- Professional ---                           [phone], Colonoscopy, flexible; diagnostic, including                            collection of specimen(s) by brushing or washing,                            when performed (separate procedure)                           99152, Moderate sedation services provided by the                            same physician or other qualified health care                            professional performing the diagnostic or                            therapeutic service that the sedation supports,                            requiring the presence of an independent trained                            observer to assist in the monitoring of the                            patient's level of consciousness and physiological                            status; initial 15 minutes of intraservice time,                            patient age 16 years or older                           [phone], Moderate sedation services; each additional                            15 minutes intraservice time                            99153, Moderate sedation services; each additional                            15 minutes intraservice time Diagnosis Code(s):        --- Professional ---                           K64.8, Other hemorrhoids  R10.31, Right lower quadrant pain                           R19.7, Diarrhea, unspecified                           K92.1, Melena (includes Hematochezia) CPT copyright 2016 [employer]. All rights reserved. The codes documented in this report are preliminary and upon coder review may  be revised to meet current compliance requirements. Jonette Eva, MD Jonette Eva MD, MD 09/04/2017 9:54:34 AM This report has been signed electronically. Number of Addenda: 0

## 2017-09-08 ENCOUNTER — Encounter (HOSPITAL_COMMUNITY): Payer: Self-pay | Admitting: Gastroenterology

## 2017-11-24 ENCOUNTER — Ambulatory Visit: Payer: Medicaid Other | Admitting: Nurse Practitioner

## 2018-03-03 ENCOUNTER — Ambulatory Visit: Payer: Medicaid Other | Admitting: Gastroenterology

## 2018-12-27 IMAGING — US US ABDOMEN LIMITED
1 series · 14 of 25 positions shown · non-contrast
Comparison: CT abdomen and pelvis 08/19/2017

CLINICAL DATA: Right upper quadrant pain and bloating for 1 day.

EXAM:
ULTRASOUND ABDOMEN LIMITED RIGHT UPPER QUADRANT

[Series 1: us abdomen limited · 0.15mm/px · 14 of 62 slices shown]
[im 1/62]
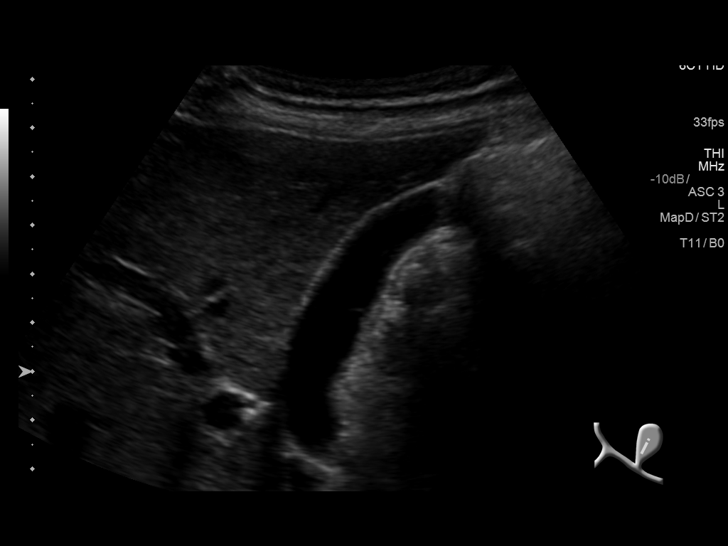
[im 6/62]
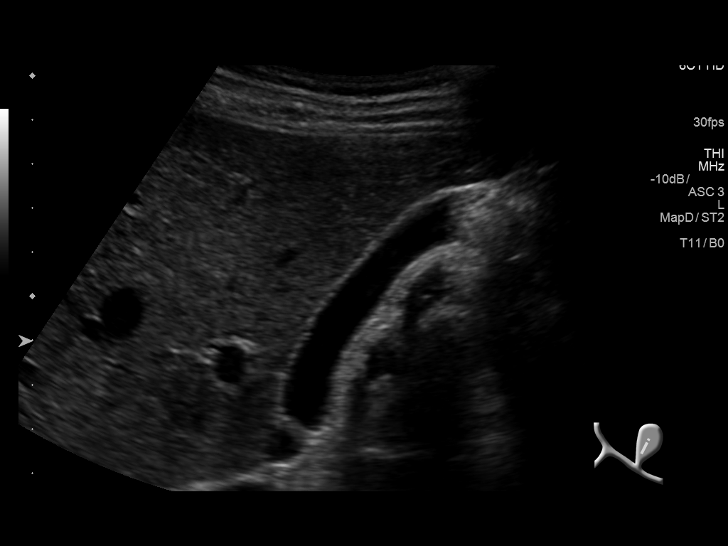
[im 11/62]
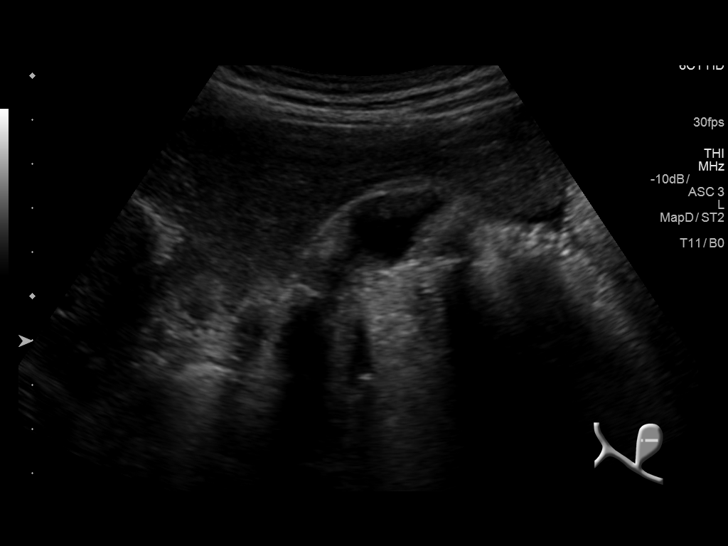
[im 16/62]
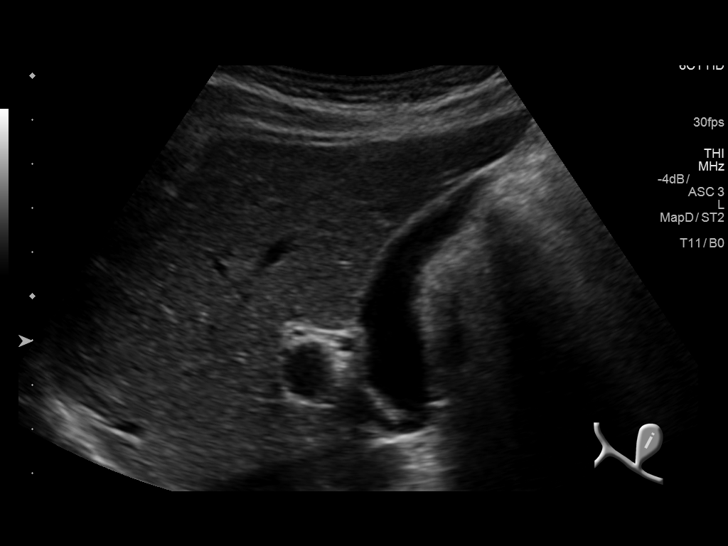
[im 21/62]
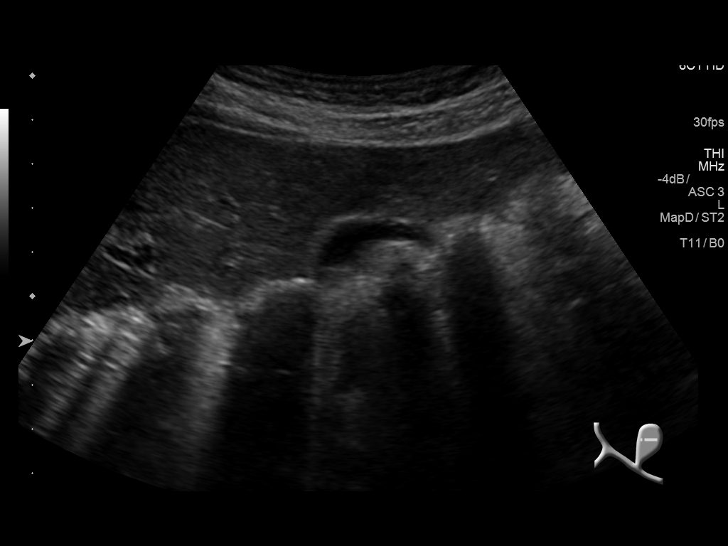
[im 23/62]
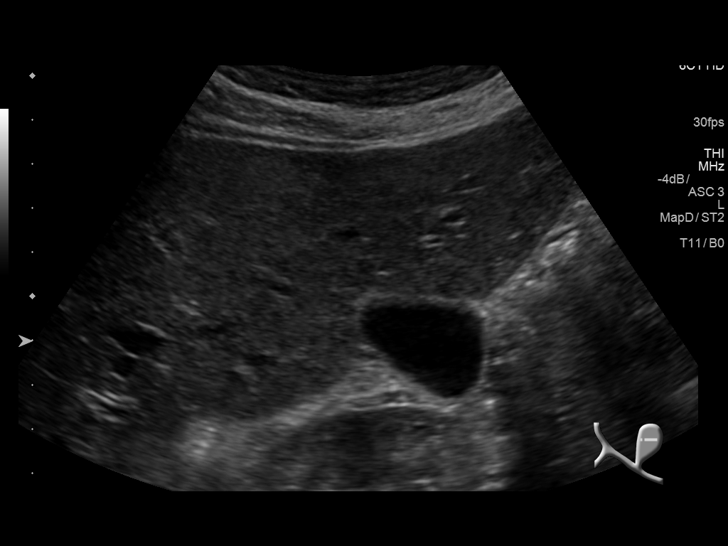
[im 28/62]
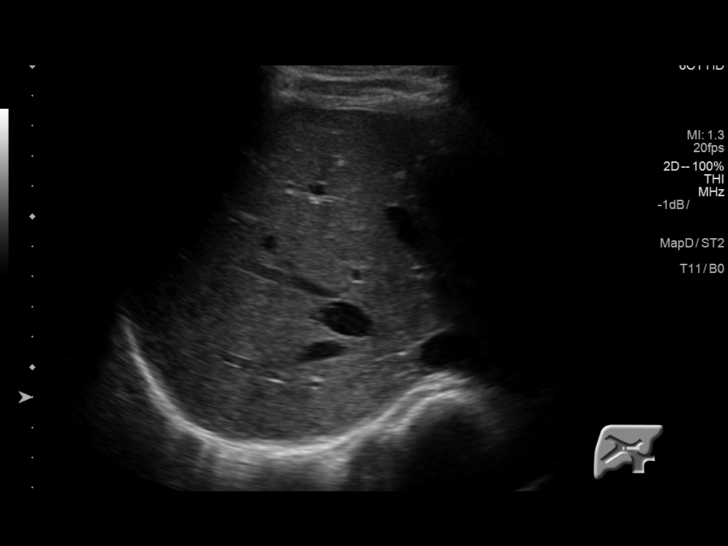
[im 34/62]
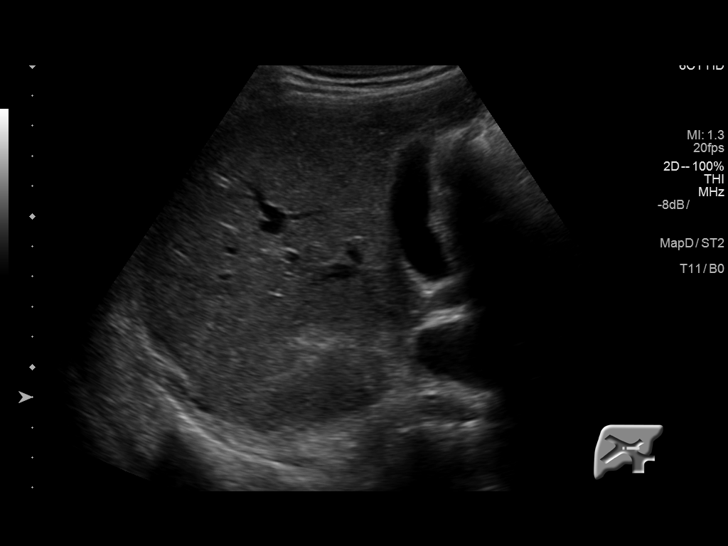
[im 39/62]
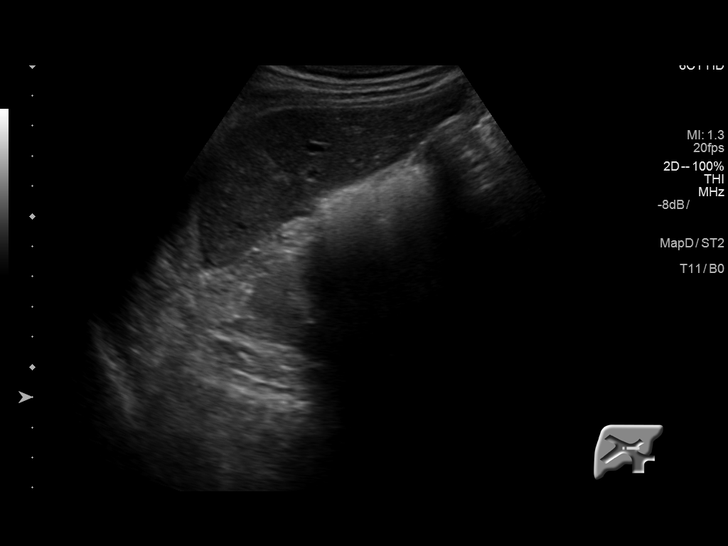
[im 41/62]
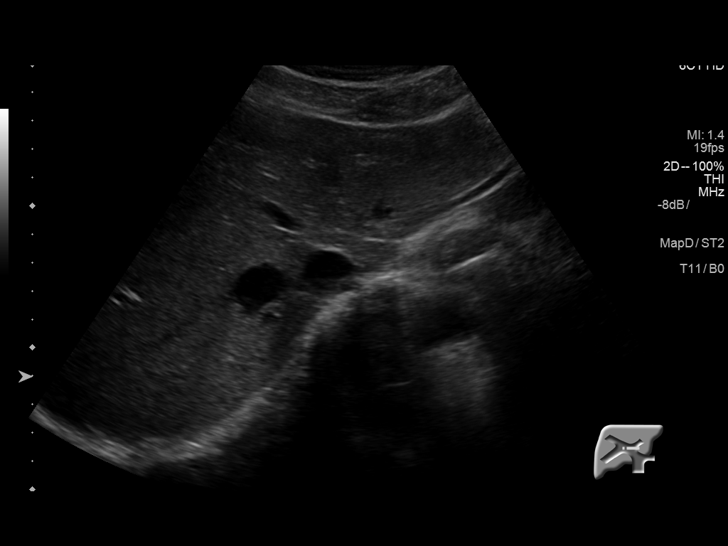
[im 46/62]
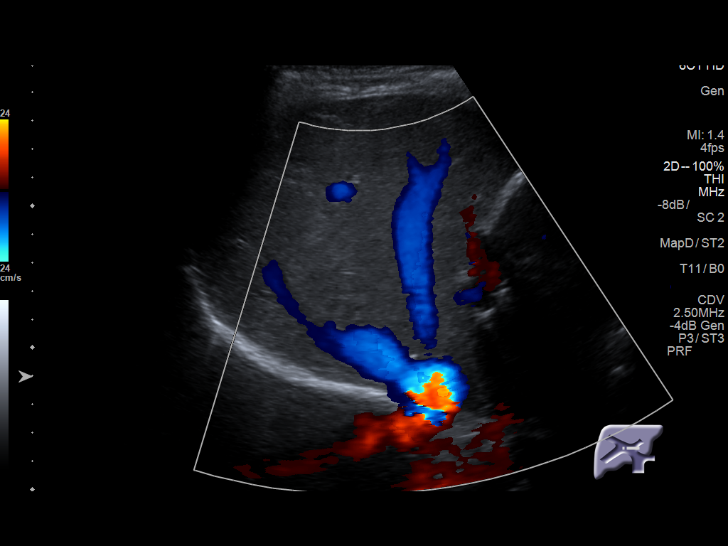
[im 51/62]
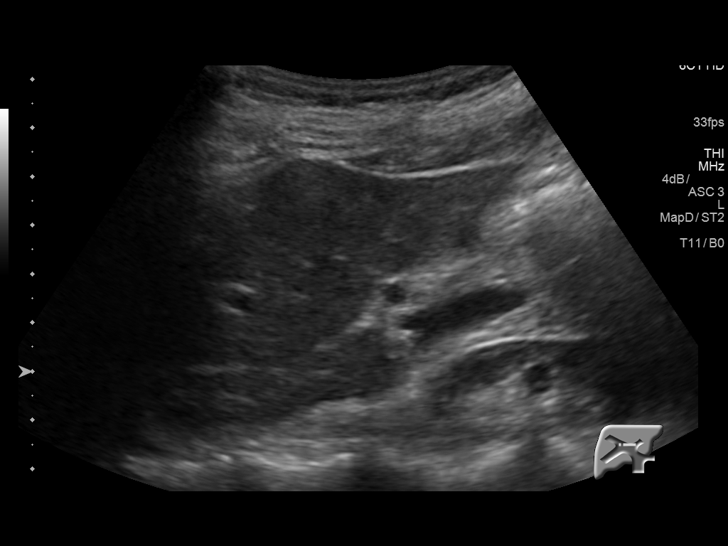
[im 56/62]
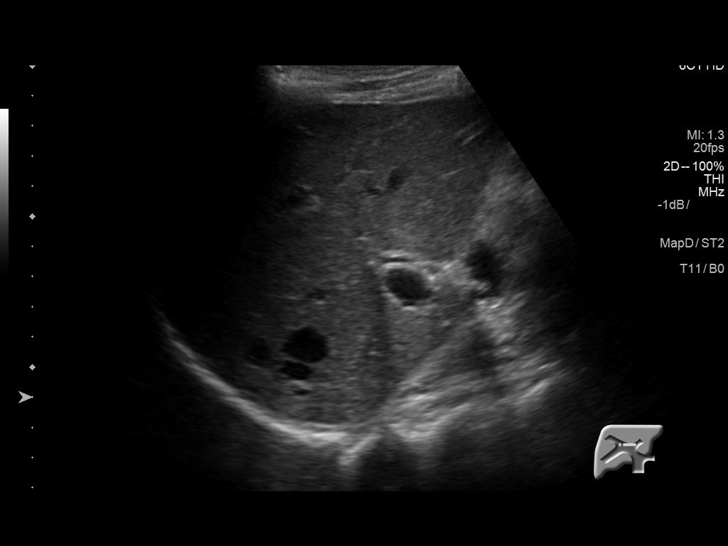
[im 62/62]
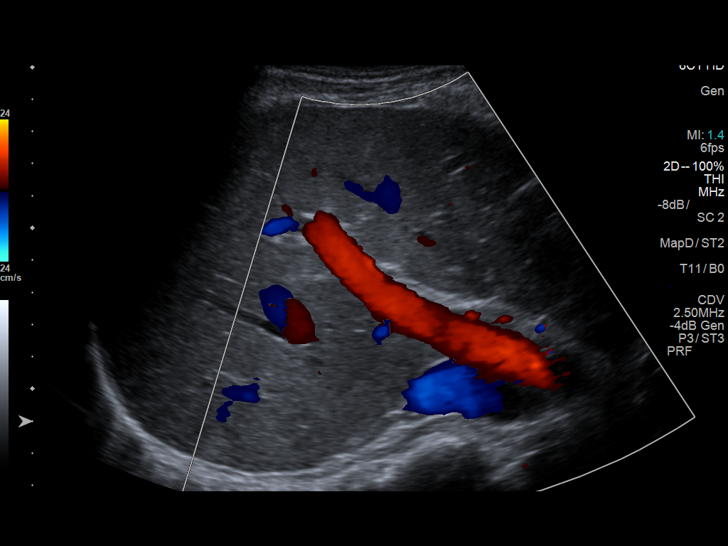

[14 of 25 positions shown; findings below may reference images not displayed]

FINDINGS: Gallbladder:

No gallstones or wall thickening visualized. No sonographic Murphy
sign noted by sonographer.

Common bile duct:

Diameter: 2 mm

Liver:

No focal lesion identified. Within normal limits in parenchymal
echogenicity. Portal vein is patent on color Doppler imaging with
normal direction of blood flow towards the liver.
IMPRESSION: Unremarkable right upper quadrant ultrasound.

## 2023-07-21 ENCOUNTER — Other Ambulatory Visit: Payer: Self-pay | Admitting: Family Medicine

## 2023-07-21 ENCOUNTER — Encounter: Payer: Self-pay | Admitting: *Deleted

## 2023-07-21 DIAGNOSIS — E785 Hyperlipidemia, unspecified: Secondary | ICD-10-CM

## 2023-09-01 ENCOUNTER — Ambulatory Visit (HOSPITAL_COMMUNITY): Payer: Medicaid Other

## 2023-09-04 ENCOUNTER — Ambulatory Visit (HOSPITAL_COMMUNITY)
Admission: RE | Admit: 2023-09-04 | Discharge: 2023-09-04 | Disposition: A | Discharge status: Home / Self Care | Payer: Self-pay | Source: Ambulatory Visit | Attending: Family Medicine | Admitting: Family Medicine

## 2023-09-04 DIAGNOSIS — E785 Hyperlipidemia, unspecified: Secondary | ICD-10-CM | POA: Insufficient documentation

## 2023-09-20 ENCOUNTER — Other Ambulatory Visit: Payer: Self-pay | Admitting: Medical Genetics

## 2023-10-07 ENCOUNTER — Other Ambulatory Visit: Payer: Self-pay | Admitting: Family Medicine

## 2023-10-07 DIAGNOSIS — R921 Mammographic calcification found on diagnostic imaging of breast: Secondary | ICD-10-CM

## 2023-10-16 ENCOUNTER — Ambulatory Visit
Admission: RE | Admit: 2023-10-16 | Discharge: 2023-10-16 | Disposition: A | Discharge status: Home / Self Care | Payer: PRIVATE HEALTH INSURANCE | Source: Ambulatory Visit | Attending: Family Medicine | Admitting: Family Medicine

## 2023-10-16 DIAGNOSIS — R921 Mammographic calcification found on diagnostic imaging of breast: Secondary | ICD-10-CM

## 2023-10-16 HISTORY — PX: BREAST BIOPSY: SHX20

## 2023-10-19 LAB — SURGICAL PATHOLOGY

## 2024-06-26 ENCOUNTER — Other Ambulatory Visit: Payer: Self-pay | Admitting: Medical Genetics

## 2024-06-26 DIAGNOSIS — Z006 Encounter for examination for normal comparison and control in clinical research program: Secondary | ICD-10-CM
